# Patient Record
Sex: Male | Born: 2000 | Race: Black or African American | Hispanic: No | Marital: Single | Smoking: Never smoker
Health system: Southern US, Community
[De-identification: ages and names within clinical notes are randomized; demographics above are authoritative.]

---
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
## 2022-07-16 ENCOUNTER — Emergency Department (HOSPITAL_COMMUNITY)
Admission: EM | Admit: 2022-07-16 | Discharge: 2022-07-16 | Disposition: A | Discharge status: Home / Self Care | Payer: Medicaid Other | Attending: Emergency Medicine | Admitting: Emergency Medicine

## 2022-07-16 ENCOUNTER — Encounter (HOSPITAL_COMMUNITY): Payer: Self-pay

## 2022-07-16 ENCOUNTER — Emergency Department (HOSPITAL_COMMUNITY): Payer: Medicaid Other

## 2022-07-16 ENCOUNTER — Other Ambulatory Visit: Payer: Self-pay

## 2022-07-16 DIAGNOSIS — S60413A Abrasion of left middle finger, initial encounter: Secondary | ICD-10-CM | POA: Insufficient documentation

## 2022-07-16 DIAGNOSIS — S62639B Displaced fracture of distal phalanx of unspecified finger, initial encounter for open fracture: Secondary | ICD-10-CM

## 2022-07-16 DIAGNOSIS — Y92810 Car as the place of occurrence of the external cause: Secondary | ICD-10-CM | POA: Diagnosis not present

## 2022-07-16 DIAGNOSIS — S6710XA Crushing injury of unspecified finger(s), initial encounter: Secondary | ICD-10-CM

## 2022-07-16 DIAGNOSIS — Y9389 Activity, other specified: Secondary | ICD-10-CM | POA: Diagnosis not present

## 2022-07-16 DIAGNOSIS — M7989 Other specified soft tissue disorders: Secondary | ICD-10-CM | POA: Insufficient documentation

## 2022-07-16 DIAGNOSIS — S6992XA Unspecified injury of left wrist, hand and finger(s), initial encounter: Secondary | ICD-10-CM | POA: Diagnosis present

## 2022-07-16 DIAGNOSIS — W231XXA Caught, crushed, jammed, or pinched between stationary objects, initial encounter: Secondary | ICD-10-CM | POA: Insufficient documentation

## 2022-07-16 DIAGNOSIS — Z23 Encounter for immunization: Secondary | ICD-10-CM | POA: Diagnosis not present

## 2022-07-16 DIAGNOSIS — S62631B Displaced fracture of distal phalanx of left index finger, initial encounter for open fracture: Secondary | ICD-10-CM | POA: Insufficient documentation

## 2022-07-16 DIAGNOSIS — S6010XA Contusion of unspecified finger with damage to nail, initial encounter: Secondary | ICD-10-CM

## 2022-07-16 MED ORDER — CEFADROXIL 500 MG PO CAPS: 500.0000 mg | ORAL_CAPSULE | Freq: Two times a day (BID) | ORAL | 0 refills | Status: AC

## 2022-07-16 MED ORDER — OXYCODONE HCL 5 MG PO TABS: 5.0000 mg | ORAL_TABLET | ORAL | 0 refills | Status: AC | PRN

## 2022-07-16 MED ORDER — LIDOCAINE HCL (PF) 1 % IJ SOLN
10.0000 mL | Freq: Once | INTRAMUSCULAR | Status: AC
  Administered 2022-07-16: 10 mL

## 2022-07-16 MED ORDER — OXYCODONE-ACETAMINOPHEN 5-325 MG PO TABS
1.0000 | ORAL_TABLET | Freq: Once | ORAL | Status: AC
  Administered 2022-07-16: 1 via ORAL
  Filled 2022-07-16: qty 1

## 2022-07-16 MED ORDER — TETANUS-DIPHTH-ACELL PERTUSSIS 5-2.5-18.5 LF-MCG/0.5 IM SUSY
0.5000 mL | PREFILLED_SYRINGE | Freq: Once | INTRAMUSCULAR | Status: AC
  Administered 2022-07-16: 0.5 mL via INTRAMUSCULAR
  Filled 2022-07-16: qty 0.5

## 2022-07-16 MED ORDER — CEFAZOLIN SODIUM-DEXTROSE 2-4 GM/100ML-% IV SOLN
2.0000 g | Freq: Once | INTRAVENOUS | Status: AC
  Administered 2022-07-16: 2 g via INTRAVENOUS
  Filled 2022-07-16: qty 100

## 2022-07-16 NOTE — Discharge Instructions (Addendum)
You were seen in the ER today for evaluation of your finger injury.  You do have a fracture to the end of your index finger.  Your middle finger does not show any signs of fracture.  Given your injury and fracture I would like for you to follow-up with a hand surgeon.  I have included information for Dr. Betha Loa into the discharge paperwork.  Please make sure you call him Monday morning first thing to schedule an appointment.  I would like for you to take 600 mg of ibuprofen and 1000 mg of Tylenol every 6 hours as needed for pain.  Additionally, I prescribed you a few narcotic pain medications to take for breakthrough pain.  Please do not drive or operate heavy machinery while on this medication to make you sleepy.  I am also prescribing you an antibiotic that she will take twice a day for the next 7 days given that this is an open fracture.  Please make sure you complete the entirety of the course.  I would like for you to take your splints off and do wound care at least once daily with Dial soap and water.  Make sure you are fingers are completely dry before wrapping and replacing the splints.  You can use the Xeroform I gave you as well as picking up some nonadherent pads.  Do not soak your hand in any dishwater, pools, lakes, Jacuzzis, motor oil, etc.  Please make sure you try to keep your hands clean as possible.  If you have any concerns, new or worsening symptoms, please return to the nearest emergency department for reevaluation.  Contact a doctor if: The skin around the cast or splint gets red or raw. The skin under the cast is very itchy or painful. Your cast or splint: Gets damaged. Feels very uncomfortable. Is too tight or too loose. Your cast becomes wet or it starts to have a soft spot or area. There is a bad smell coming from under your cast. You get an object stuck under your cast. Get help right away if: You get any symptoms of compartment syndrome, such as: Very bad pain or  pressure under the cast. Numbness, tingling, coldness, or pale or bluish skin. The part of your body above or below the cast is swollen, and it turns a different color (is discolored). You cannot feel or move your fingers or toes. Your pain gets worse. There is fluid leaking through the cast. You have trouble breathing or shortness of breath. You have chest pain. These symptoms may be an emergency. Get help right away. Call your local emergency services (911 in the [place] Do not wait to see if the symptoms will go away. Do not drive yourself to the hospital.

## 2022-07-16 NOTE — ED Triage Notes (Signed)
Pt to er, pt states that he was working on a car adjusting the suspension and the spring crushed his L middle and pointer finger.  Pt has bleeding to finger, bleeding is controled at this time.

## 2022-07-16 NOTE — ED Provider Notes (Signed)
22 year old male with crush injury to finger and open comminuted fracture of the distal phalanx.  Pending antibiotic administration and discharge with splint placement.  Will check a splint placement prior to discharge to confirm neurovascular status. No additional care provided to this patient during ED stay. Hand has been consulted and pt provided with appropriate follow up and recommendations prior to assumption of care.   1800 - Antibiotics received, splint placed. Pt with good pain control. Neurovascularly intact. Stable for discharge. Aware of need for outpatient follow up which pt expressed understanding of.    Richardson Dopp 07/16/22 1810    Cathren Laine, MD 07/18/22 [phone]

## 2022-07-16 NOTE — ED Provider Notes (Signed)
[place] EMERGENCY DEPARTMENT AT [facility] Provider Note   CSN: [id number] Arrival date & time: 07/16/22  1257     History {Add pertinent medical, surgical, social history, OB history to HPI:1} Chief Complaint  Patient presents with   Finger Injury    Carlos Davies is a 22 y.o. male.  HPI     Home Medications Prior to Admission medications   Not on File      Allergies    Patient has no known allergies.    Review of Systems   Review of Systems  Physical Exam Updated Vital Signs BP 138/71 (BP Location: Right Arm)   Pulse 76   Temp 98 F (36.7 C) (Oral)   Resp 17   Ht 5\' 9"  (1.753 m)   Wt 68 kg   SpO2 100%   BMI 22.15 kg/m  Physical Exam  ED Results / Procedures / Treatments   Labs (all labs ordered are listed, but only abnormal results are displayed) Labs Reviewed - No data to display  EKG None  Radiology DG Hand Complete Left  Result Date: 07/16/2022 CLINICAL DATA:  Crush injury to fingers while working on car. Injury to middle and index finger. EXAM: LEFT HAND - COMPLETE 3+ VIEW COMPARISON:  None Available. FINDINGS: Soft tissue swelling and irregularity along the ulnar aspect of the mid third digit consistent with laceration. A punctate focus of high attenuation is identified in the region of soft tissue irregularity. No fracture identified in the third digit. There is a comminuted fracture through the distal tuft of the index finger. No definite foreign body identified. IMPRESSION: 1. Comminuted fracture through the distal tuft of the index finger. 2. Soft tissue swelling and irregularity along the ulnar aspect of the mid third digit consistent with laceration. A punctate focus of high attenuation is identified in the region of soft tissue irregularity, possibly a tiny foreign body. No fracture identified in the third digit. Electronically Signed   By: Gerome Sam III M.D.   On: 07/16/2022 13:46    Procedures Procedures   Medications  Ordered in ED Medications  ceFAZolin (ANCEF) IVPB 2g/100 mL premix (has no administration in time range)  lidocaine (PF) (XYLOCAINE) 1 % injection 10 mL (has no administration in time range)  Tdap (BOOSTRIX) injection 0.5 mL (0.5 mLs Intramuscular Given 07/16/22 1440)  oxyCODONE-acetaminophen (PERCOCET/ROXICET) 5-325 MG per tablet 1 tablet (1 tablet Oral Given 07/16/22 1440)    ED Course/ Medical Decision Making/ A&P    Medical Decision Making Amount and/or Complexity of Data Reviewed Radiology: ordered.  Risk Prescription drug management.   22 y.o. male presents to the ER today for evaluation of painin fingers after crush injury. Differential diagnosis includes but is not limited to fracture, open fracture, compartment syndrome, tendon/ligament injury. Vital signs unremarkable. Physical exam as noted above.   XR imaging shows 1. Comminuted fracture through the distal tuft of the index finger. 2. Soft tissue swelling and irregularity along the ulnar aspect of the mid third digit consistent with laceration. A punctate focus of high attenuation is identified in the region of soft tissue irregularity, possibly a tiny foreign body. No fracture identified in the third digit.   ***  Spoke with Dr. Merlyn Lot about the middle finger swelling. He reports that has long as patient has some movement of his finger and brisk cap refill as well as sensation, can be discharged home with low suspicion of compartment syndrome.   ***We discussed plan at bedside. We discussed  strict return precautions and red flag symptoms. The patient verbalized their understanding and agrees to the plan. The patient is stable and being discharged home in good condition.  ***Portions of this report may have been transcribed using voice recognition software. Every effort was made to ensure accuracy; however, inadvertent computerized transcription errors may be present.   Final Clinical Impression(s) / ED Diagnoses Final  diagnoses:  None    Rx / DC Orders ED Discharge Orders     None

## 2022-11-17 ENCOUNTER — Encounter (HOSPITAL_COMMUNITY): Payer: Self-pay

## 2022-11-17 ENCOUNTER — Emergency Department (HOSPITAL_COMMUNITY)
Admission: EM | Admit: 2022-11-17 | Discharge: 2022-11-17 | Disposition: A | Discharge status: Home / Self Care | Payer: Medicaid Other | Attending: Emergency Medicine | Admitting: Emergency Medicine

## 2022-11-17 ENCOUNTER — Emergency Department (HOSPITAL_COMMUNITY): Payer: Medicaid Other

## 2022-11-17 DIAGNOSIS — Y9373 Activity, racquet and hand sports: Secondary | ICD-10-CM | POA: Diagnosis not present

## 2022-11-17 DIAGNOSIS — X509XXA Other and unspecified overexertion or strenuous movements or postures, initial encounter: Secondary | ICD-10-CM | POA: Insufficient documentation

## 2022-11-17 DIAGNOSIS — S53401A Unspecified sprain of right elbow, initial encounter: Secondary | ICD-10-CM | POA: Diagnosis not present

## 2022-11-17 DIAGNOSIS — S46911A Strain of unspecified muscle, fascia and tendon at shoulder and upper arm level, right arm, initial encounter: Secondary | ICD-10-CM

## 2022-11-17 DIAGNOSIS — M25521 Pain in right elbow: Secondary | ICD-10-CM | POA: Diagnosis present

## 2022-11-17 NOTE — ED Triage Notes (Signed)
Pt presents with c/o right arm pain. Pt reports he has been doing some working out and the pain started on Saturday in his right forearm.

## 2022-11-17 NOTE — ED Notes (Signed)
Portable Xray at bedside.

## 2022-11-17 NOTE — ED Notes (Signed)
AVS provided to and discussed with patient and family member at bedside. Pt verbalizes understanding of discharge instructions and denies any questions or concerns at this time. Pt has ride home. Pt ambulated out of department independently with steady gait.  

## 2022-11-17 NOTE — Discharge Instructions (Addendum)
If you notice new or worsening or uncontrolled pain, weakness or numbness in your hand or arm, or any other new/concerning symptoms then return to the ER or call 911.

## 2022-11-17 NOTE — ED Provider Notes (Signed)
[place] EMERGENCY DEPARTMENT AT [facility] Provider Note   CSN: [id number] Arrival date & time: 11/17/22  1053     History  Chief Complaint  Patient presents with   Arm Pain    Carlos Davies is a 22 y.o. male.  HPI 22 year old male presents with right elbow pain.  5 days ago he was arm wrestling and after 1 attempt felt pain and soreness in his elbow.  This is on the anterior aspect.  However the pain has gradually worsened.  He has a general soreness but whenever he tries to do anything such as twisting an object, he will notice pain.  Today he fell on the steps and landed on his arm which caused the pain to come back in his elbow.  Right now the rest of his arm feels fine.  There is no numbness or tingling or weakness in the arm.  He took some ibuprofen and Tylenol prior to arrival and feels a lot better.  Home Medications Prior to Admission medications   Medication Sig Start Date End Date Taking? Authorizing Provider  cefadroxil (DURICEF) 500 MG capsule Take 1 capsule (500 mg total) by mouth 2 (two) times daily. 07/16/22   Achille Rich, PA-C  oxyCODONE (ROXICODONE) 5 MG immediate release tablet Take 1 tablet (5 mg total) by mouth every 4 (four) hours as needed for severe pain. 07/16/22   Achille Rich, PA-C      Allergies    Patient has no known allergies.    Review of Systems   Review of Systems  Musculoskeletal:  Positive for arthralgias. Negative for joint swelling.  Neurological:  Negative for weakness and numbness.    Physical Exam Updated Vital Signs BP 121/67 (BP Location: Right Arm)   Pulse 60   Temp 99 F (37.2 C) (Oral)   Resp 16   SpO2 100%  Physical Exam Vitals and nursing note reviewed.  Constitutional:      Appearance: He is well-developed.  HENT:     Head: Normocephalic and atraumatic.  Cardiovascular:     Rate and Rhythm: Normal rate and regular rhythm.     Pulses:          Radial pulses are 2+ on the right side.  Pulmonary:      Effort: Pulmonary effort is normal.  Musculoskeletal:     Right elbow: No swelling. Normal range of motion. No tenderness.     Right forearm: No swelling, edema or tenderness.     Right wrist: No tenderness. Normal range of motion.     Right hand: No tenderness. Normal range of motion.     Comments: Grossly normal sensation in right forearm and hand. Intact strength  Skin:    General: Skin is warm and dry.  Neurological:     Mental Status: He is alert.     ED Results / Procedures / Treatments   Labs (all labs ordered are listed, but only abnormal results are displayed) Labs Reviewed - No data to display  EKG None  Radiology DG Elbow Complete Right  Result Date: 11/17/2022 CLINICAL DATA:  Recent injury arm wrestling, pain EXAM: RIGHT ELBOW - COMPLETE 3+ VIEW COMPARISON:  None Available. FINDINGS: There is no evidence of fracture, dislocation, or joint effusion. There is no evidence of arthropathy or other focal bone abnormality. Soft tissues are unremarkable. IMPRESSION: Negative. Electronically Signed   By: Judie Petit.  Shick M.D.   On: 11/17/2022 12:54    Procedures Procedures    Medications Ordered  in ED Medications - No data to display  ED Course/ Medical Decision Making/ A&P                                 Medical Decision Making Amount and/or Complexity of Data Reviewed Radiology: ordered and independent interpretation performed.    Details: No elbow fracture   Patient likely has an elbow strain.  Could be tendinitis.  No fracture seen on x-ray or joint effusion.  He has normal range of motion of the joint.  His compartments are soft and I have very low suspicion for compartment syndrome.  Will recommend continued NSAIDs and have him follow-up with sports medicine.  Neurovascularly intact.  Discharged home with return precautions.        Final Clinical Impression(s) / ED Diagnoses Final diagnoses:  Elbow strain, right, initial encounter    Rx / DC Orders ED  Discharge Orders     None         Pricilla Loveless, MD 11/17/22 [phone]

## 2023-02-04 ENCOUNTER — Encounter (HOSPITAL_COMMUNITY): Payer: Self-pay

## 2023-02-04 ENCOUNTER — Emergency Department (HOSPITAL_COMMUNITY)
Admission: EM | Admit: 2023-02-04 | Discharge: 2023-02-04 | Disposition: A | Discharge status: Home / Self Care | Payer: Medicaid Other | Attending: Emergency Medicine | Admitting: Emergency Medicine

## 2023-02-04 DIAGNOSIS — L600 Ingrowing nail: Secondary | ICD-10-CM | POA: Insufficient documentation

## 2023-02-04 NOTE — Discharge Instructions (Addendum)
You were seen in the emergency room today for ingrown toenail.  Please call podiatrist attached discharge paper to schedule appointment for follow-up. Alternate Tylenol or Ibuprofen for pain control.  Return to emergency room with new or worsening symptoms.

## 2023-02-04 NOTE — ED Triage Notes (Signed)
Pt c/o increasing swelling and discoloration to R great toe x4 months.  Denies pain.  Pt feels the nail is ingrown.

## 2023-02-04 NOTE — ED Provider Notes (Signed)
[place] EMERGENCY DEPARTMENT AT [facility] Provider Note   CSN: [id number] Arrival date & time: 02/04/23  1308     History  Chief Complaint  Patient presents with   Nail Problem    Carlos Davies is a 22 y.o. male without significant past medical history reporting to emergency room with 4 months of right great toe irritation and ingrown toenail. No injury or trauma to nail, never had this happen before, does not seem to be getting worse.  Denies significant pain over the area. Denies fever, chills, difficulty weightbearing, chest pain, shortness of breath abdominal pain nausea vomiting diarrhea.  HPI     Home Medications Prior to Admission medications   Medication Sig Start Date End Date Taking? Authorizing Provider  cefadroxil (DURICEF) 500 MG capsule Take 1 capsule (500 mg total) by mouth 2 (two) times daily. 07/16/22   Achille Rich, PA-C  oxyCODONE (ROXICODONE) 5 MG immediate release tablet Take 1 tablet (5 mg total) by mouth every 4 (four) hours as needed for severe pain. 07/16/22   Achille Rich, PA-C      Allergies    Patient has no known allergies.    Review of Systems   Review of Systems  Skin:  Positive for wound.    Physical Exam Updated Vital Signs BP 134/71 (BP Location: Right Arm)   Pulse (!) 52   Temp 98.1 F (36.7 C) (Oral)   Resp 18   Ht 5\' 9"  (1.753 m)   Wt 70.3 kg   SpO2 100%   BMI 22.89 kg/m  Physical Exam Vitals and nursing note reviewed.  Constitutional:      General: He is not in acute distress.    Appearance: He is not toxic-appearing.  HENT:     Head: Normocephalic and atraumatic.  Eyes:     General: No scleral icterus.    Conjunctiva/sclera: Conjunctivae normal.  Cardiovascular:     Rate and Rhythm: Normal rate and regular rhythm.     Pulses: Normal pulses.     Heart sounds: Normal heart sounds.  Pulmonary:     Effort: Pulmonary effort is normal. No respiratory distress.     Breath sounds: Normal breath sounds.   Abdominal:     General: Abdomen is flat. Bowel sounds are normal.     Palpations: Abdomen is soft.     Tenderness: There is no abdominal tenderness.  Musculoskeletal:     Comments: Right great toe ingrown toenail. No surrounding erythema, no area of fluctuance suggesting an abscess.  No signs of septic joint.  Skin:    General: Skin is warm and dry.     Findings: No lesion.  Neurological:     General: No focal deficit present.     Mental Status: He is alert and oriented to person, place, and time. Mental status is at baseline.     ED Results / Procedures / Treatments   Labs (all labs ordered are listed, but only abnormal results are displayed) Labs Reviewed - No data to display  EKG None  Radiology No results found.  Procedures Procedures    Medications Ordered in ED Medications - No data to display  ED Course/ Medical Decision Making/ A&P                                 Medical Decision Making  This patient presents to the ED for concern of great toe nail problem, this  involves an extensive number of treatment options, and is a complaint that carries with it a high risk of complications and morbidity.  The differential diagnosis includes ingrown toenail, abscess, cellulitis, blister, onychomycosis   Co morbidities that complicate the patient evaluation  Denies   Additional history obtained:  None   Lab Tests:  None    Imaging Studies ordered:  None    Problem List / ED Course / Critical interventions / Medication management  Patient coming to emergency room with ingrown toenail. No oncocytosis or area of discoloration under the toe nail suggesting hematoma or infection. No obvious area of abscess, no surrounding erythema or sign of infection.  No sign of septic joint.  At this time patient is not reporting significant discomfort associated with ingrown toenail, discussed conservative treatment options and referral to podiatry.  Patient and family  expressed under standing agree to plan. Discussed return precautions if signs of infection develop.   I have reviewed the patients home medicines and have made adjustments as needed   Plan  F/u w/ PCP in 2-3d to ensure resolution of sx.  Patient was given return precautions. Patient stable for discharge at this time.  Patient educated on sx/dx and verbalized understanding of plan. Return to ER w/ new or worsening sx.          Final Clinical Impression(s) / ED Diagnoses Final diagnoses:  Ingrown toenail    Rx / DC Orders ED Discharge Orders     None         Smitty Knudsen, PA-C 02/04/23 1045    Lorre Nick, MD 02/05/23 [phone]

## 2023-02-15 ENCOUNTER — Encounter: Payer: Self-pay | Admitting: Podiatry

## 2023-02-15 ENCOUNTER — Ambulatory Visit: Payer: Medicaid Other | Admitting: Podiatry

## 2023-02-15 VITALS — Ht 69.0 in | Wt 155.0 lb

## 2023-02-15 DIAGNOSIS — L03031 Cellulitis of right toe: Secondary | ICD-10-CM | POA: Diagnosis not present

## 2023-02-15 DIAGNOSIS — L600 Ingrowing nail: Secondary | ICD-10-CM | POA: Diagnosis not present

## 2023-02-15 MED ORDER — DOXYCYCLINE HYCLATE 100 MG PO TABS: 100.0000 mg | ORAL_TABLET | Freq: Two times a day (BID) | ORAL | 0 refills | Status: AC

## 2023-02-15 NOTE — Progress Notes (Signed)
Subjective:   Patient ID: Carlos Davies, male   DOB: 22 y.o.   MRN: [id number]   HPI Patient presents with painful ingrown toenail deformity right hallux medial border that is very irritated for him and he is tried wider shoes and other modalities.  Patient does not smoke likes to be active   Review of Systems  All other systems reviewed and are negative.       Objective:  Physical Exam Vitals and nursing note reviewed.  Constitutional:      Appearance: He is well-developed.  Pulmonary:     Effort: Pulmonary effort is normal.  Musculoskeletal:        General: Normal range of motion.  Skin:    General: Skin is warm.  Neurological:     Mental Status: He is alert.     Neurovascular status intact muscle strength adequate range of motion adequate with incurvated right hallux lateral border painful when pressed localized drainage no proximal edema erythema drainage noted     Assessment:  Severe ingrown toenail deformity right hallux with paronychia component     Plan:  H&P reviewed at great length and placed on antibiotic due to the fact he does have some irritated tissue but I do recommend correction today explained procedure risk patient wants surgery and signed consent form I infiltrated the right big toe 60 mg like Marcaine mixture sterile prep done using sterile instrumentation removed

## 2023-02-15 NOTE — Patient Instructions (Signed)

## 2023-02-16 ENCOUNTER — Telehealth: Payer: Self-pay | Admitting: Podiatry

## 2023-02-16 NOTE — Telephone Encounter (Signed)
Patient called 1 day status post Ingrown Toenail nail avulsion on 11/27 with concern of pain to the site. Reassurance given to the patient. Reiterated soaking and wound care instructions. Advised patient that patient should improve over time, to limit activity and weight bearing, continue with tylenol and ibuprofen as needed. Discussed signs and symptoms of developing and worsening infection and to contact office if these occur.
